# Patient Record
Sex: Female | Born: 1950 | Race: Black or African American | Hispanic: No | Marital: Married | Smoking: Current every day smoker
Health system: Southern US, Community
[De-identification: ages and names within clinical notes are randomized; demographics above are authoritative.]

## PROBLEM LIST (undated history)

## (undated) DIAGNOSIS — I2111 ST elevation (STEMI) myocardial infarction involving right coronary artery: Secondary | ICD-10-CM

## (undated) DIAGNOSIS — E785 Hyperlipidemia, unspecified: Secondary | ICD-10-CM

## (undated) HISTORY — PX: TUBAL LIGATION: SHX77

## (undated) HISTORY — PX: ABDOMINAL HYSTERECTOMY: SHX81

## (undated) HISTORY — PX: APPENDECTOMY: SHX54

## (undated) HISTORY — PX: LAPAROSCOPIC OOPHERECTOMY: SHX6507

---

## 2004-10-11 ENCOUNTER — Ambulatory Visit: Payer: Self-pay | Admitting: Unknown Physician Specialty

## 2004-11-16 ENCOUNTER — Ambulatory Visit: Payer: Self-pay | Admitting: Family Medicine

## 2005-07-22 ENCOUNTER — Ambulatory Visit: Payer: Self-pay | Admitting: Family Medicine

## 2006-06-01 ENCOUNTER — Ambulatory Visit: Payer: Self-pay | Admitting: Gastroenterology

## 2013-11-24 DIAGNOSIS — F17200 Nicotine dependence, unspecified, uncomplicated: Secondary | ICD-10-CM | POA: Insufficient documentation

## 2013-11-24 DIAGNOSIS — E785 Hyperlipidemia, unspecified: Secondary | ICD-10-CM | POA: Insufficient documentation

## 2013-11-24 DIAGNOSIS — R319 Hematuria, unspecified: Secondary | ICD-10-CM | POA: Insufficient documentation

## 2013-11-24 DIAGNOSIS — I10 Essential (primary) hypertension: Secondary | ICD-10-CM | POA: Insufficient documentation

## 2014-10-09 ENCOUNTER — Encounter: Payer: Self-pay | Admitting: Podiatry

## 2014-10-09 ENCOUNTER — Other Ambulatory Visit: Payer: Self-pay | Admitting: *Deleted

## 2014-10-09 ENCOUNTER — Ambulatory Visit (INDEPENDENT_AMBULATORY_CARE_PROVIDER_SITE_OTHER): Payer: PRIVATE HEALTH INSURANCE | Admitting: Podiatry

## 2014-10-09 ENCOUNTER — Encounter: Payer: Self-pay | Admitting: *Deleted

## 2014-10-09 ENCOUNTER — Ambulatory Visit (INDEPENDENT_AMBULATORY_CARE_PROVIDER_SITE_OTHER): Payer: PRIVATE HEALTH INSURANCE

## 2014-10-09 VITALS — BP 145/90 | HR 92 | Resp 18 | Ht 64.0 in | Wt 184.0 lb

## 2014-10-09 DIAGNOSIS — M722 Plantar fascial fibromatosis: Secondary | ICD-10-CM | POA: Diagnosis not present

## 2014-10-09 DIAGNOSIS — R0989 Other specified symptoms and signs involving the circulatory and respiratory systems: Secondary | ICD-10-CM | POA: Diagnosis not present

## 2014-10-09 NOTE — Progress Notes (Signed)
   Subjective:    Patient ID: Emma Weaver, female    DOB: November 09, 1950, 64 y.o.   MRN: [id number]  HPI  Patient presents today for concerns of "knots" on the bottom of her foot which has been ongoing for "years". She denies any pain to the area. She previously went to another podiatrist last year. At that time a wrap and discussed shoe changes was applied but no other treatment. She states the masses have not been growing recently. Denies any skin changes over the knots. Denies any tingling or numbness. No injury or trauma.she does that she has pain to her legs and she can walk approximately 2 blocks, stop due to pain in her legs. She denies any swelling to her legs or any increase in warmth. She has not had any vascular studies apparently previously. No other complaints at this time.    Review of Systems  All other systems reviewed and are negative.      Objective:   Physical Exam AAO x3, NAD DP/PT pulses decreased bilaterall Protective sensation intact with Simms Weinstein monofilament, vibratory sensation intact, Achilles tendon reflex intact On the plantar aspect of bilateral feet along the midfoot on the medial band of the plantar fascia there are 2 well defined, circumferential nodules which appear to be on the plantar fascia. They are large measuring about 2.5x2 cm each. There is no tenderness to palpation over the area and there is no overlying skin changes or discoloration. Subjectively they have no changed in size.  No other areas of tenderness to bilateral lower extremities. MMT 5/5, ROM WNL.  No open lesions or pre-ulcerative lesions.  No overlying edema, erythema, increase in warmth to bilateral lower extremities.  No pain with calf compression, swelling, warmth, erythema bilaterally.    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 -X-rays were obtained and reviewed with the patient.  -Treatment  options discussed including all alternatives, risks, and complications -Discussed etiology of the soft tissue masses. -I discussed both conservative and surgical treatment options. She would like to hold off on any injections, surgery. I prescribed compound cream to include verapamil to be applied daily. Discussed with her this may or may not help will be can try. I also discussed orthotics with offloading of the area. -Given claudication symptoms and decreased pulses (which has apparently been ongoing for quite a while and is not new), I ordered vascular studies. -Follow-up after vascular studies or sooner if any problems arise. In the meantime, encouraged to call the office with any questions, concerns, change in symptoms.   Celesta Gentile, DPM

## 2014-10-09 NOTE — Progress Notes (Signed)
Emma Weaver is scheduled with [facility] on 10/14/2014 at 345

## 2014-10-14 LAB — ABI

## 2014-10-16 ENCOUNTER — Telehealth: Payer: Self-pay | Admitting: *Deleted

## 2014-10-16 DIAGNOSIS — I739 Peripheral vascular disease, unspecified: Secondary | ICD-10-CM

## 2014-10-16 NOTE — Telephone Encounter (Signed)
Pt referred to [facility] [phone] and last chart notes and referral cover sheet sent to [phone].

## 2014-10-16 NOTE — Telephone Encounter (Signed)
I spoke with Otila Kluver - [facility], she states send pt's office notes and cover sheet for referral to [phone].  Done.

## 2014-10-16 NOTE — Telephone Encounter (Signed)
Entered in error

## 2014-10-16 NOTE — Telephone Encounter (Signed)
-----   Message from Trula Slade, DPM sent at 10/16/2014  2:12 PM EDT ----- This patient needs to be seen by [facility] for PVD as a consult. She already had studies which are abnormal. She also has claudication symptoms.

## 2014-10-17 ENCOUNTER — Telehealth: Payer: Self-pay | Admitting: *Deleted

## 2014-10-17 NOTE — Telephone Encounter (Signed)
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 If she wants them she can come in to be scanned. Not sure about insurance coverage.

## 2014-10-17 NOTE — Telephone Encounter (Addendum)
Pt states she received 2 medication creams in the mail and they were to be kept cold, but the ice pack was warm, would that affect the medication, how is she to use them.  I told pt to contact the customer service for the creams and check if they were okay and to use them as the instructions on the creams ordered.  Pt states she was told the creams were for pain, she doesn't have pain just lumps.  I told pt the main ingredient Verapamil in one of the creams was for sensations and lumps and she said she did have those.  Pt states she has already seen the Vein and Vascular people but did not get her results, I explained that her feet were not getting good blood flow, so Dr. Jacqualyn Posey referred back to [facility] for evaluation and treatment, pt states understanding and I gave the [facility] appt line number.  Then the pt asked about the shoes Dr. Jacqualyn Posey wanted her to have.  I told her I would ask and call her again.  I gave pt Dr. Leigh Aurora recommendation for the orthotics and she states she will set up appt for the orthotic scans, if someone will check her coverage.  Pt states she does not understand the medication cream Dr. Jacqualyn Posey ordered and I had explained the medication from what she read to me, but she says she would like someone to show her.  I told pt I would contact the [place] office to inform the pt would be by with the mediation to for explanation today or tomorrow.

## 2014-10-22 NOTE — Telephone Encounter (Signed)
Please advise 

## 2014-10-28 ENCOUNTER — Telehealth: Payer: Self-pay | Admitting: *Deleted

## 2014-10-28 NOTE — Telephone Encounter (Signed)
I am going to call today to see if the orthotics are covered. Emma Weaver

## 2014-10-28 NOTE — Telephone Encounter (Signed)
-----   Message from Jackquline Denmark sent at 10/28/2014  4:41 PM EDT ----- Regarding: MEDICATION INSTRUCTIONS Contact: [phone] PT CALLED AND NEEDS SOME INSTRUCTIONS ON HOW SHE SHOULD TAKE THE MEDICATION SHE WAS PRESCRIBED. PT IS A [facility] OF DR Leigh Aurora.

## 2014-10-29 NOTE — Telephone Encounter (Signed)
Please call this pt and have her bring the medications to the [place] office so someone can explain them to her.  I tried numerous times and she states she does not understand, maybe it needs to be hands on training.

## 2014-10-30 NOTE — Telephone Encounter (Signed)
Called patient today and told her to come by the [place] office and i would see what i could help her with. Emma Weaver

## 2014-11-04 NOTE — Telephone Encounter (Signed)
CALLED [employer] AND SPOKE WITH JOANN AND STATED THAT I NEEDED TO CHECK COVERAGE FOR INSERTS ([id number]) AND PER JOANN THEY ARE NOT COVERED UNDER THE PATIENT'S PLAN AND CALLED THE PATIENT TO LET HER KNOW. Emma Weaver

## 2014-11-05 NOTE — Telephone Encounter (Signed)
PATIENT WAS CALLED AND WAS WANTING INSERTS AND THE [employer] DOES NOT COVER UNDER THE PATIENT'S PLAN. Emma Weaver

## 2016-02-09 ENCOUNTER — Encounter: Payer: Self-pay | Admitting: Emergency Medicine

## 2016-02-09 ENCOUNTER — Emergency Department
Admission: EM | Admit: 2016-02-09 | Discharge: 2016-02-09 | Disposition: A | Discharge status: Home / Self Care | Payer: No Typology Code available for payment source | Attending: Emergency Medicine | Admitting: Emergency Medicine

## 2016-02-09 ENCOUNTER — Emergency Department: Payer: No Typology Code available for payment source

## 2016-02-09 DIAGNOSIS — I10 Essential (primary) hypertension: Secondary | ICD-10-CM | POA: Insufficient documentation

## 2016-02-09 DIAGNOSIS — Y9389 Activity, other specified: Secondary | ICD-10-CM | POA: Insufficient documentation

## 2016-02-09 DIAGNOSIS — S99921A Unspecified injury of right foot, initial encounter: Secondary | ICD-10-CM | POA: Diagnosis present

## 2016-02-09 DIAGNOSIS — F17200 Nicotine dependence, unspecified, uncomplicated: Secondary | ICD-10-CM | POA: Insufficient documentation

## 2016-02-09 DIAGNOSIS — Y999 Unspecified external cause status: Secondary | ICD-10-CM | POA: Diagnosis not present

## 2016-02-09 DIAGNOSIS — S92334A Nondisplaced fracture of third metatarsal bone, right foot, initial encounter for closed fracture: Secondary | ICD-10-CM | POA: Diagnosis not present

## 2016-02-09 DIAGNOSIS — Y92481 Parking lot as the place of occurrence of the external cause: Secondary | ICD-10-CM | POA: Insufficient documentation

## 2016-02-09 MED ORDER — TRAMADOL HCL 50 MG PO TABS
100.0000 mg | ORAL_TABLET | Freq: Once | ORAL | Status: AC
Start: 2016-02-09 — End: 2016-02-09
  Administered 2016-02-09: 100 mg via ORAL

## 2016-02-09 MED ORDER — TRAMADOL HCL 50 MG PO TABS
ORAL_TABLET | ORAL | Status: AC
  Administered 2016-02-09: 100 mg via ORAL
  Filled 2016-02-09: qty 2

## 2016-02-09 MED ORDER — TRAMADOL HCL 50 MG PO TABS: 50.0000 mg | ORAL_TABLET | Freq: Four times a day (QID) | ORAL | 0 refills | Status: AC | PRN
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
## 2016-02-09 NOTE — Discharge Instructions (Signed)
Please call the number provided for orthopedics to arrange a follow-up appointment in 5-7 days for recheck/reevaluation. Please use her crutches and do not bear weight on the foot until he can be evaluated by orthopedics. Please use her pain medication as needed, as prescribed. You may also use Tylenol as needed for discomfort in addition to prescribe medication. Return to the emergency department for any worsening pain, or any other symptom personally concerning to yourself.

## 2016-09-13 DIAGNOSIS — I2111 ST elevation (STEMI) myocardial infarction involving right coronary artery: Secondary | ICD-10-CM

## 2016-09-13 HISTORY — DX: ST elevation (STEMI) myocardial infarction involving right coronary artery: I21.11

## 2016-09-14 ENCOUNTER — Other Ambulatory Visit: Payer: Self-pay | Admitting: Family Medicine

## 2016-09-14 DIAGNOSIS — Z1239 Encounter for other screening for malignant neoplasm of breast: Secondary | ICD-10-CM

## 2017-06-20 ENCOUNTER — Ambulatory Visit
Admission: RE | Admit: 2017-06-20 | Discharge: 2017-06-20 | Disposition: A | Discharge status: Home / Self Care | Payer: Medicare PPO | Source: Ambulatory Visit | Attending: Family Medicine | Admitting: Family Medicine

## 2017-06-20 DIAGNOSIS — Z1231 Encounter for screening mammogram for malignant neoplasm of breast: Secondary | ICD-10-CM | POA: Diagnosis present

## 2017-06-20 DIAGNOSIS — Z1239 Encounter for other screening for malignant neoplasm of breast: Secondary | ICD-10-CM

## 2017-10-04 ENCOUNTER — Encounter
Admission: RE | Disposition: A | Discharge status: Home / Self Care | Payer: Self-pay | Source: Ambulatory Visit | Attending: Internal Medicine

## 2017-10-04 ENCOUNTER — Ambulatory Visit: Admission: RE | Admit: 2017-10-04 | Payer: Medicare PPO | Source: Ambulatory Visit | Admitting: Internal Medicine

## 2017-10-04 ENCOUNTER — Encounter: Admission: RE | Payer: Self-pay | Source: Ambulatory Visit

## 2017-10-04 ENCOUNTER — Ambulatory Visit
Admission: RE | Admit: 2017-10-04 | Discharge: 2017-10-04 | Disposition: A | Discharge status: Home / Self Care | Payer: Medicare PPO | Source: Ambulatory Visit | Attending: Internal Medicine | Admitting: Internal Medicine

## 2017-10-04 ENCOUNTER — Ambulatory Visit: Payer: Medicare PPO | Admitting: Anesthesiology

## 2017-10-04 ENCOUNTER — Encounter: Payer: Self-pay | Admitting: *Deleted

## 2017-10-04 DIAGNOSIS — F17200 Nicotine dependence, unspecified, uncomplicated: Secondary | ICD-10-CM | POA: Insufficient documentation

## 2017-10-04 DIAGNOSIS — K219 Gastro-esophageal reflux disease without esophagitis: Secondary | ICD-10-CM | POA: Insufficient documentation

## 2017-10-04 DIAGNOSIS — J449 Chronic obstructive pulmonary disease, unspecified: Secondary | ICD-10-CM | POA: Insufficient documentation

## 2017-10-04 DIAGNOSIS — K640 First degree hemorrhoids: Secondary | ICD-10-CM | POA: Insufficient documentation

## 2017-10-04 DIAGNOSIS — D123 Benign neoplasm of transverse colon: Secondary | ICD-10-CM | POA: Insufficient documentation

## 2017-10-04 DIAGNOSIS — Z955 Presence of coronary angioplasty implant and graft: Secondary | ICD-10-CM | POA: Diagnosis not present

## 2017-10-04 DIAGNOSIS — I2510 Atherosclerotic heart disease of native coronary artery without angina pectoris: Secondary | ICD-10-CM | POA: Insufficient documentation

## 2017-10-04 DIAGNOSIS — D125 Benign neoplasm of sigmoid colon: Secondary | ICD-10-CM | POA: Insufficient documentation

## 2017-10-04 DIAGNOSIS — I119 Hypertensive heart disease without heart failure: Secondary | ICD-10-CM | POA: Insufficient documentation

## 2017-10-04 DIAGNOSIS — K449 Diaphragmatic hernia without obstruction or gangrene: Secondary | ICD-10-CM | POA: Diagnosis not present

## 2017-10-04 DIAGNOSIS — D509 Iron deficiency anemia, unspecified: Secondary | ICD-10-CM | POA: Diagnosis not present

## 2017-10-04 DIAGNOSIS — E785 Hyperlipidemia, unspecified: Secondary | ICD-10-CM | POA: Insufficient documentation

## 2017-10-04 DIAGNOSIS — D120 Benign neoplasm of cecum: Secondary | ICD-10-CM | POA: Diagnosis not present

## 2017-10-04 DIAGNOSIS — R195 Other fecal abnormalities: Secondary | ICD-10-CM | POA: Insufficient documentation

## 2017-10-04 DIAGNOSIS — Z888 Allergy status to other drugs, medicaments and biological substances status: Secondary | ICD-10-CM | POA: Diagnosis not present

## 2017-10-04 HISTORY — PX: ESOPHAGOGASTRODUODENOSCOPY (EGD) WITH PROPOFOL: SHX5813

## 2017-10-04 HISTORY — DX: ST elevation (STEMI) myocardial infarction involving right coronary artery: I21.11

## 2017-10-04 HISTORY — PX: COLONOSCOPY WITH PROPOFOL: SHX5780

## 2017-10-04 HISTORY — DX: Hyperlipidemia, unspecified: E78.5

## 2017-10-04 SURGERY — COLONOSCOPY WITH PROPOFOL
Anesthesia: General

## 2017-10-04 SURGERY — ESOPHAGOGASTRODUODENOSCOPY (EGD) WITH PROPOFOL
Anesthesia: General

## 2017-10-04 MED ORDER — LIDOCAINE 2% (20 MG/ML) 5 ML SYRINGE
INTRAMUSCULAR | Status: DC | PRN
  Administered 2017-10-04: 40 mg via INTRAVENOUS

## 2017-10-04 MED ORDER — PROPOFOL 10 MG/ML IV BOLUS
INTRAVENOUS | Status: DC | PRN
  Administered 2017-10-04: 100 mg via INTRAVENOUS

## 2017-10-04 MED ORDER — PROPOFOL 500 MG/50ML IV EMUL
INTRAVENOUS | Status: DC | PRN
  Administered 2017-10-04: 160 ug/kg/min via INTRAVENOUS

## 2017-10-04 MED ORDER — SODIUM CHLORIDE 0.9 % IV SOLN
INTRAVENOUS | Status: DC
  Administered 2017-10-04: 1000 mL via INTRAVENOUS
  Administered 2017-10-04: 10:00:00 via INTRAVENOUS

## 2017-10-04 MED ORDER — IPRATROPIUM-ALBUTEROL 0.5-2.5 (3) MG/3ML IN SOLN
RESPIRATORY_TRACT | Status: AC
  Administered 2017-10-04: 3 mL via RESPIRATORY_TRACT
  Filled 2017-10-04: qty 3

## 2017-10-04 MED ORDER — PHENYLEPHRINE HCL 10 MG/ML IJ SOLN
INTRAMUSCULAR | Status: DC | PRN
  Administered 2017-10-04 (×3): 100 ug via INTRAVENOUS

## 2017-10-04 MED ORDER — IPRATROPIUM-ALBUTEROL 0.5-2.5 (3) MG/3ML IN SOLN
3.0000 mL | Freq: Once | RESPIRATORY_TRACT | Status: AC
  Administered 2017-10-04: 3 mL via RESPIRATORY_TRACT

## 2017-10-04 MED ORDER — FENTANYL CITRATE (PF) 100 MCG/2ML IJ SOLN
INTRAMUSCULAR | Status: AC
  Filled 2017-10-04: qty 2

## 2017-10-04 MED ORDER — FENTANYL CITRATE (PF) 100 MCG/2ML IJ SOLN
INTRAMUSCULAR | Status: DC | PRN
  Administered 2017-10-04 (×2): 50 ug via INTRAVENOUS

## 2017-10-04 MED ORDER — PROPOFOL 10 MG/ML IV BOLUS
INTRAVENOUS | Status: AC
  Filled 2017-10-04: qty 20

## 2017-10-04 MED ORDER — LIDOCAINE HCL (PF) 1 % IJ SOLN
2.0000 mL | Freq: Once | INTRAMUSCULAR | Status: AC
  Administered 2017-10-04: 0.3 mL via INTRADERMAL

## 2017-10-04 MED ORDER — PROPOFOL 500 MG/50ML IV EMUL
INTRAVENOUS | Status: AC
  Filled 2017-10-04: qty 50

## 2017-10-04 MED ORDER — LIDOCAINE HCL (PF) 1 % IJ SOLN
INTRAMUSCULAR | Status: AC
  Administered 2017-10-04: 0.3 mL via INTRADERMAL
  Filled 2017-10-04: qty 2

## 2017-10-04 NOTE — Anesthesia Post-op Follow-up Note (Signed)
Anesthesia QCDR form completed.        

## 2017-10-04 NOTE — Anesthesia Preprocedure Evaluation (Signed)
Anesthesia Evaluation  Patient identified by MRN, date of birth, ID band Patient awake    Reviewed: Allergy & Precautions, H&P , NPO status , Patient's Chart, lab work & pertinent test results  History of Anesthesia Complications Negative for: history of anesthetic complications  Airway Mallampati: III  TM Distance: >3 FB Neck ROM: limited    Dental  (+) Chipped, Poor Dentition   Pulmonary COPD, Current Smoker,           Cardiovascular Exercise Tolerance: Good hypertension, (-) angina+ Past MI and + Cardiac Stents  (-) DOE      Neuro/Psych negative neurological ROS  negative psych ROS   GI/Hepatic negative GI ROS, Neg liver ROS, neg GERD  ,  Endo/Other  negative endocrine ROS  Renal/GU negative Renal ROS  negative genitourinary   Musculoskeletal   Abdominal   Peds  Hematology negative hematology ROS (+)   Anesthesia Other Findings Past Medical History: No date: Hyperlipidemia 09/13/2016: 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 (HCC)  Past Surgical History: No date: ABDOMINAL HYSTERECTOMY No date: APPENDECTOMY No date: LAPAROSCOPIC OOPHERECTOMY; Right No date: TUBAL LIGATION  BMI    Body Mass Index:  33.30 kg/m      Reproductive/Obstetrics negative OB ROS                             Anesthesia Physical Anesthesia Plan  ASA: III  Anesthesia Plan: General   Post-op Pain Management:    Induction: Intravenous  PONV Risk Score and Plan: Propofol infusion and TIVA  Airway Management Planned: Natural Airway and Nasal Cannula  Additional Equipment:   Intra-op Plan:   Post-operative Plan:   Informed Consent: I have reviewed the patients History and Physical, chart, labs and discussed the procedure including the risks, benefits and alternatives for the proposed anesthesia with the patient or authorized representative who has indicated  his/her understanding and acceptance.   Dental Advisory Given  Plan Discussed with: Anesthesiologist, CRNA and Surgeon  Anesthesia Plan Comments: (Patient consented for risks of anesthesia including but not limited to:  - adverse reactions to medications - risk of intubation if required - damage to teeth, lips or other oral mucosa - sore throat or hoarseness - Damage to heart, brain, lungs or loss of life  Patient voiced understanding.)        Anesthesia Quick Evaluation

## 2017-10-04 NOTE — Transfer of Care (Signed)
Immediate Anesthesia Transfer of Care Note  Patient: Emma Weaver  Procedure(s) Performed: COLONOSCOPY WITH PROPOFOL (N/A ) ESOPHAGOGASTRODUODENOSCOPY (EGD) WITH PROPOFOL (N/A )  Patient Location: PACU and Endoscopy Unit  Anesthesia Type:General  Level of Consciousness: awake, drowsy and patient cooperative  Airway & Oxygen Therapy: Patient Spontanous Breathing and Patient connected to nasal cannula oxygen  Post-op Assessment: Report given to RN and Post -op Vital signs reviewed and stable  Post vital signs: Reviewed and stable  Last Vitals:  Vitals Value Taken Time  BP 94/59 10/04/2017 11:20 AM  Temp 36.1 C 10/04/2017 11:16 AM  Pulse 84 10/04/2017 11:21 AM  Resp 19 10/04/2017 11:21 AM  SpO2 100 % 10/04/2017 11:21 AM  Vitals shown include unvalidated device data.  Last Pain:  Vitals:   10/04/17 1120  TempSrc:   PainSc: 0-No pain         Complications: No apparent anesthesia complications

## 2017-10-04 NOTE — Op Note (Signed)
[facility] Patient Name: James Lafalce Procedure Date: 10/04/2017 10:24 AM MRN: [id number] Account #: [id number] Date of Birth: 1950-09-29 Admit Type: Outpatient Age: 67 Room: [facility] ENDO ROOM 2 Gender: Female Note Status: Finalized Procedure:            Upper GI endoscopy Indications:          Suspected upper gastrointestinal bleeding in patient                        with unexplained iron deficiency anemia, Heme positive                        stool Providers:            Benay Pike. [name] MD, MD Referring MD:         Bo Mcclintock. Vickki Muff (Referring MD) Medicines:            Propofol per Anesthesia Complications:        No immediate complications. Procedure:            Pre-Anesthesia Assessment:                       - The risks and benefits of the procedure and the                        sedation options and risks were discussed with the                        patient. All questions were answered and informed                        consent was obtained.                       - Patient identification and proposed procedure were                        verified prior to the procedure by the nurse. The                        procedure was verified in the procedure room.                       - ASA Grade Assessment: III - A patient with severe                        systemic disease.                       - After reviewing the risks and benefits, the patient                        was deemed in satisfactory condition to undergo the                        procedure.                       After obtaining informed consent, the endoscope was  passed under direct vision. Throughout the procedure,                        the patient's blood pressure, pulse, and oxygen                        saturations were monitored continuously. The Endoscope                        was introduced through the mouth, and advanced to the         third part of duodenum. The upper GI endoscopy was                        accomplished without difficulty. The patient tolerated                        the procedure well. Findings:      The examined esophagus was normal.      A 1 cm hiatal hernia was present.      The examined duodenum was normal.      The exam was otherwise without abnormality. Impression:           - Normal esophagus.                       - 1 cm hiatal hernia.                       - Normal examined duodenum.                       - The examination was otherwise normal.                       - No specimens collected. Recommendation:       - Proceed with colonoscopy Procedure Code(s):    --- Professional ---                       [phone], Esophagogastroduodenoscopy, flexible, transoral;                        diagnostic, including collection of specimen(s) by                        brushing or washing, when performed (separate procedure) Diagnosis Code(s):    --- Professional ---                       R19.5, Other fecal abnormalities                       D50.9, Iron deficiency anemia, unspecified                       K44.9, Diaphragmatic hernia without obstruction or                        gangrene CPT copyright 2017 American Medical Association. All rights reserved. The codes documented in this report are preliminary and upon coder review may  be revised to meet current compliance requirements. Efrain Sella MD, MD 10/04/2017 10:44:20 AM This report has been signed electronically. Number of Addenda: 0  Note Initiated On: 10/04/2017 10:24 AM      [facility]

## 2017-10-04 NOTE — Op Note (Signed)
[facility]astroenter[facility] Patient Name: Emma Weaver Procedure Date: 10/04/2017 10:24 AM MRN: [id number] Account #: [id number] Date of Birth: November 14, 1950 Admit Type: Outpatient Age: 67 Room: [facility] ENDO ROOM 2 Gender: Female Note Status: Finalized Procedure:            Colonoscopy Indications:          Heme positive stool Providers:            Benay Pike. [place] MD, MD Medicines:            Propofol per Anesthesia Complications:        No immediate complications. Procedure:            Pre-Anesthesia Assessment:                       - The risks and benefits of the procedure and the                        sedation options and risks were discussed with the                        patient. All questions were answered and informed                        consent was obtained.                       - Patient identification and proposed procedure were                        verified prior to the procedure by the nurse. The                        procedure was verified in the procedure room.                       - ASA Grade Assessment: III - A patient with severe                        systemic disease.                       - After reviewing the risks and benefits, the patient                        was deemed in satisfactory condition to undergo the                        procedure.                       After obtaining informed consent, the colonoscope was                        passed under direct vision. Throughout the procedure,                        the patient's blood pressure, pulse, and oxygen                        saturations were monitored continuously. The  Colonoscope was introduced through the anus and                        advanced to the the cecum, identified by appendiceal                        orifice and ileocecal valve. The colonoscopy was                        performed without difficulty. The patient tolerated the                         procedure well. The quality of the bowel preparation                        was good. Findings:      The perianal and digital rectal examinations were normal. Pertinent       negatives include normal sphincter tone and no palpable rectal lesions.      A 8 mm polyp was found in the cecum. The polyp was semi-pedunculated.       The polyp was removed with a hot snare. Resection and retrieval were       complete. To prevent bleeding after the polypectomy, two hemostatic       clips were successfully placed. There was no bleeding during, or at the       end, of the procedure.      A 5 mm polyp was found in the transverse colon. The polyp was sessile.       The polyp was removed with a cold snare. Resection and retrieval were       complete.      Four sessile polyps were found in the sigmoid colon, distal transverse       colon and cecum. The polyps were 2 to 3 mm in size. These polyps were       removed with a jumbo cold forceps. Resection and retrieval were complete.      Non-bleeding internal hemorrhoids were found during retroflexion. The       hemorrhoids were Grade I (internal hemorrhoids that do not prolapse).      The exam was otherwise without abnormality. Impression:           - One 8 mm polyp in the cecum, removed with a hot                        snare. Resected and retrieved. Clips were placed.                       - One 5 mm polyp in the transverse colon, removed with                        a cold snare. Resected and retrieved.                       - Four 2 to 3 mm polyps in the sigmoid colon, in the                        distal transverse colon and in the cecum, removed with  a jumbo cold forceps. Resected and retrieved.                       - Non-bleeding internal hemorrhoids.                       - The examination was otherwise normal. Recommendation:       - Patient has a contact number available for                         emergencies. The signs and symptoms of potential                        delayed complications were discussed with the patient.                        Return to normal activities tomorrow. Written discharge                        instructions were provided to the patient.                       - Will consider small bowel wireless capsule endoscopy                        based upon review of pathology results.                       - Repeat colonoscopy for surveillance based on                        pathology results.                       - Return to GI office in 3 months.                       - The findings and recommendations were discussed with                        the patient and their family. Procedure Code(s):    --- Professional ---                       [phone], Colonoscopy, flexible; with removal of tumor(s),                        polyp(s), or other lesion(s) by snare technique                       45380, 59, Colonoscopy, flexible; with biopsy, single                        or multiple Diagnosis Code(s):    --- Professional ---                       R19.5, Other fecal abnormalities                       K64.0, First degree hemorrhoids  D12.0, Benign neoplasm of cecum                       D12.3, Benign neoplasm of transverse colon (hepatic                        flexure or splenic flexure)                       D12.5, Benign neoplasm of sigmoid colon CPT copyright 2017 American Medical Association. All rights reserved. The codes documented in this report are preliminary and upon coder review may  be revised to meet current compliance requirements. Efrain Sella MD, MD 10/04/2017 11:20:01 AM This report has been signed electronically. Number of Addenda: 0 Note Initiated On: 10/04/2017 10:24 AM Scope Withdrawal Time: 0 hours 19 minutes 19 seconds  Total Procedure Duration: 0 hours 24 minutes 44 seconds       [facility]

## 2017-10-04 NOTE — Anesthesia Postprocedure Evaluation (Signed)
Anesthesia Post Note  Patient: Emma Weaver  Procedure(s) Performed: COLONOSCOPY WITH PROPOFOL (N/A ) ESOPHAGOGASTRODUODENOSCOPY (EGD) WITH PROPOFOL (N/A )  Patient location during evaluation: Endoscopy Anesthesia Type: General Level of consciousness: awake and alert Pain management: pain level controlled Vital Signs Assessment: post-procedure vital signs reviewed and stable Respiratory status: spontaneous breathing, nonlabored ventilation, respiratory function stable and patient connected to nasal cannula oxygen Cardiovascular status: blood pressure returned to baseline and stable Postop Assessment: no apparent nausea or vomiting Anesthetic complications: no     Last Vitals:  Vitals:   10/04/17 1120 10/04/17 1122  BP: (!) 94/59 (!) 94/59  Pulse:  85  Resp:  18  Temp:  (!) 36.1 C  SpO2:  100%    Last Pain:  Vitals:   10/04/17 1156  TempSrc:   PainSc: 0-No pain                 Precious Haws Myrlene Riera

## 2017-10-04 NOTE — H&P (Signed)
Outpatient short stay form Pre-procedure 10/04/2017 8:43 AM Emma Weaver K. [name], M.D.  Primary Physician: Cherlynn Polo, M.D.  Reason for visit:  Iron deficiency anemia, heme positive stool.  History of present illness:  67 y/o female without upper GI symptoms presents with intermittent hematochezia and was noted to have iron deficiency anemia with a ferritin of 9. Had colonoscopy several years ago with "few polyps removed". Denies change in bowel habits, involuntary weight loss. No CP, SOB or DOE currently.    No current facility-administered medications for this encounter.   Current Outpatient Medications:  .  EPINEPHrine (EPIPEN 2-PAK) 0.3 mg/0.3 mL IJ SOAJ injection, Inject into the muscle as needed., Disp: , Rfl:  .  levocetirizine (XYZAL) 5 MG tablet, Take 5 mg by mouth every evening., Disp: , Rfl:  .  metoprolol succinate (TOPROL-XL) 25 MG 24 hr tablet, Take 25 mg by mouth daily., Disp: , Rfl:  .  nitroGLYCERIN (NITROSTAT) 0.4 MG SL tablet, Place 0.4 mg under the tongue every 5 (five) minutes as needed for chest pain., Disp: , Rfl:  .  vitamin B-12 (CYANOCOBALAMIN) 500 MCG tablet, Take 500 mcg by mouth daily., Disp: , Rfl:  .  atorvastatin (LIPITOR) 20 MG tablet, Take by mouth., Disp: , Rfl:  .  fluticasone (FLONASE) 50 MCG/ACT nasal spray, Place into the nose., Disp: , Rfl:   No medications prior to admission.     Allergies  Allergen Reactions  . Lisinopril     Angioedema     Past Medical History:  Diagnosis Date  . Hyperlipidemia   . ST elevation (STEMI) myocardial infarction involving right coronary artery ([place]) 09/13/2016    Review of systems:  Otherwise negative.    Physical Exam  Gen: Alert, oriented. Appears stated age.  HEENT: [place]/AT. PERRLA. Lungs: CTA, no wheezes. CV: RR nl S1, S2. Abd: soft, benign, no masses. BS+ Ext: No edema. Pulses 2+    Planned procedures: Proceed with EGD and colonoscopy. The patient understands the nature of the planned  procedure, indications, risks, alternatives and potential complications including but not limited to bleeding, infection, perforation, damage to internal organs and possible oversedation/side effects from anesthesia. The patient agrees and gives consent to proceed.  Please refer to procedure notes for findings, recommendations and patient disposition/instructions.     Nawal Burling K. [name], M.D. Gastroenterology 10/04/2017  8:43 AM

## 2017-10-05 ENCOUNTER — Encounter: Payer: Self-pay | Admitting: Internal Medicine

## 2017-10-05 LAB — SURGICAL PATHOLOGY

## 2018-08-08 ENCOUNTER — Other Ambulatory Visit: Payer: Self-pay | Admitting: Family Medicine

## 2018-08-08 DIAGNOSIS — Z1231 Encounter for screening mammogram for malignant neoplasm of breast: Secondary | ICD-10-CM

## 2018-10-08 ENCOUNTER — Other Ambulatory Visit: Payer: Self-pay | Admitting: Family Medicine

## 2018-10-08 DIAGNOSIS — Z780 Asymptomatic menopausal state: Secondary | ICD-10-CM

## 2019-03-21 ENCOUNTER — Other Ambulatory Visit: Payer: Medicare PPO

## 2019-04-16 ENCOUNTER — Ambulatory Visit
Admission: RE | Admit: 2019-04-16 | Discharge: 2019-04-16 | Disposition: A | Discharge status: Home / Self Care | Payer: Medicare PPO | Source: Ambulatory Visit | Attending: Family Medicine | Admitting: Family Medicine

## 2019-04-16 DIAGNOSIS — Z780 Asymptomatic menopausal state: Secondary | ICD-10-CM | POA: Insufficient documentation

## 2019-04-16 DIAGNOSIS — Z1231 Encounter for screening mammogram for malignant neoplasm of breast: Secondary | ICD-10-CM | POA: Insufficient documentation

## 2021-05-24 IMAGING — MG DIGITAL SCREENING BILAT W/ TOMO W/ CAD
8 series · 8 of 24 positions shown · non-contrast
Comparison: Previous exam(s).

CLINICAL DATA: Screening.

EXAM:
DIGITAL SCREENING BILATERAL MAMMOGRAM WITH TOMO AND CAD

[L MLO synth-2D]
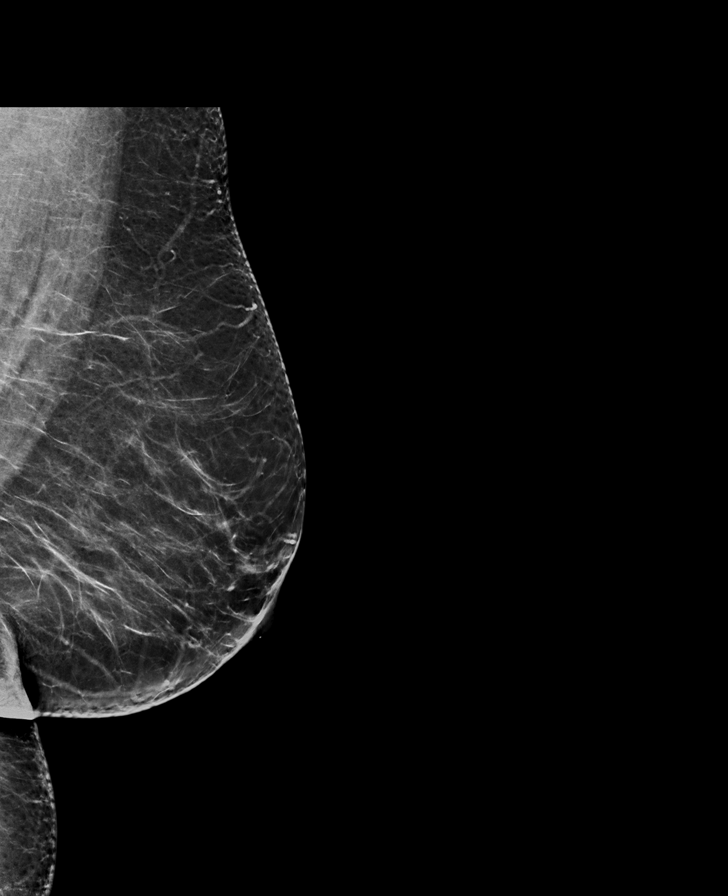

[R MLO synth-2D]
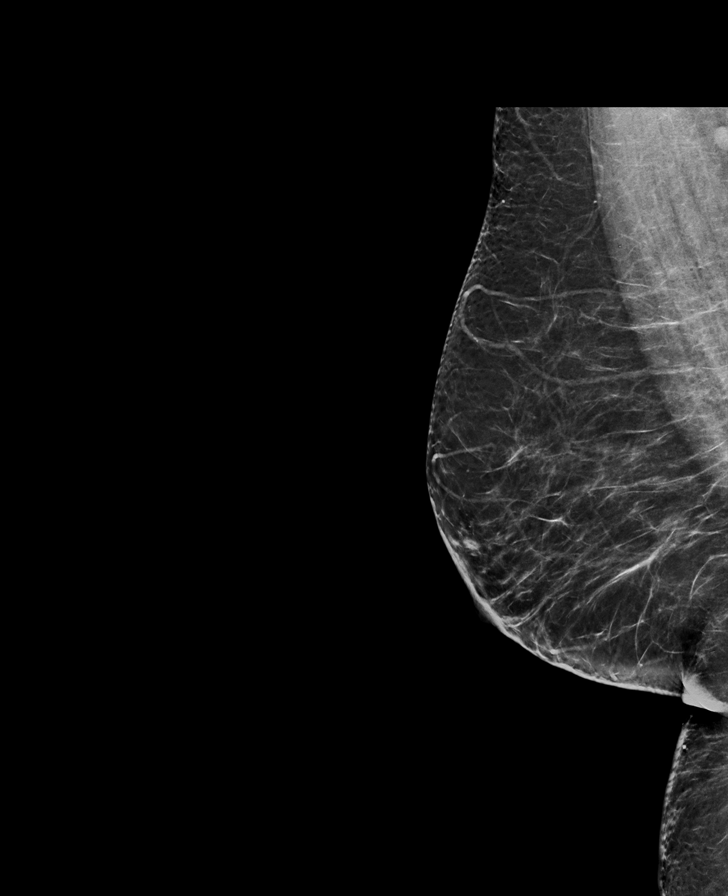

[L CC synth-2D]
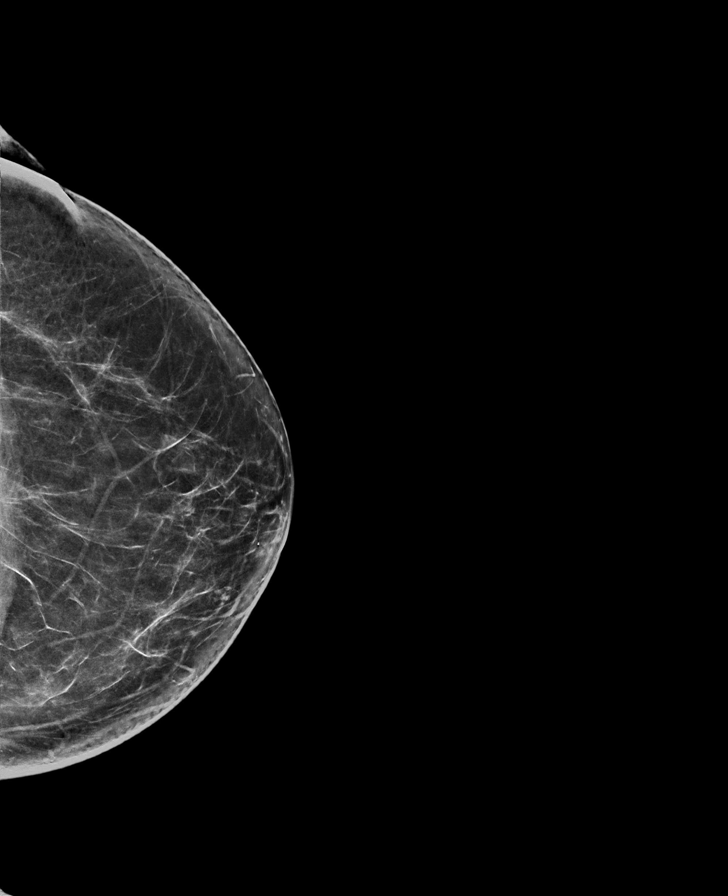

[R CC synth-2D]
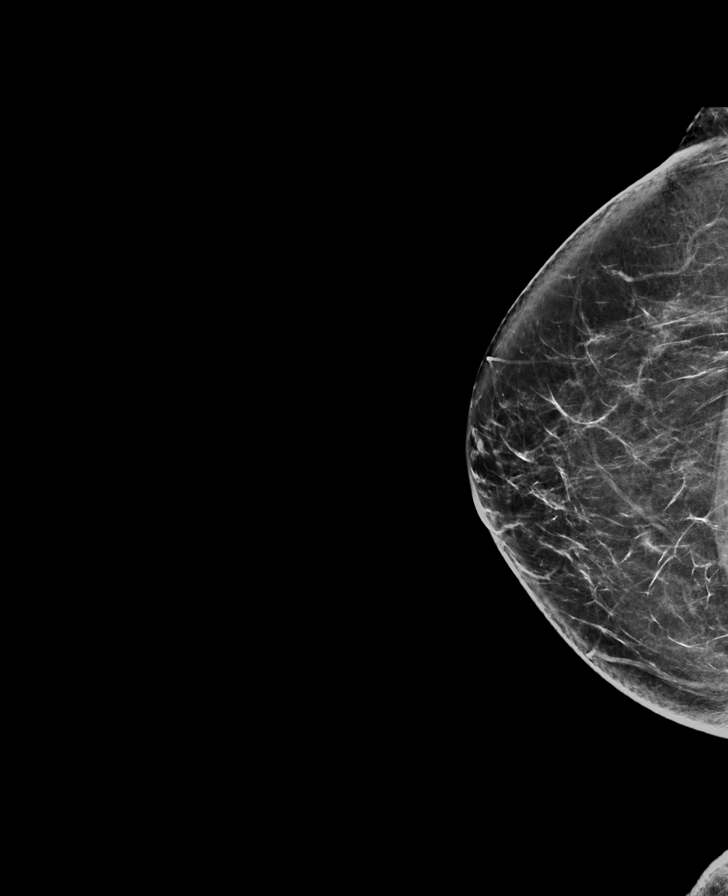

[R CC tomo · tomo slice 35/70.0]
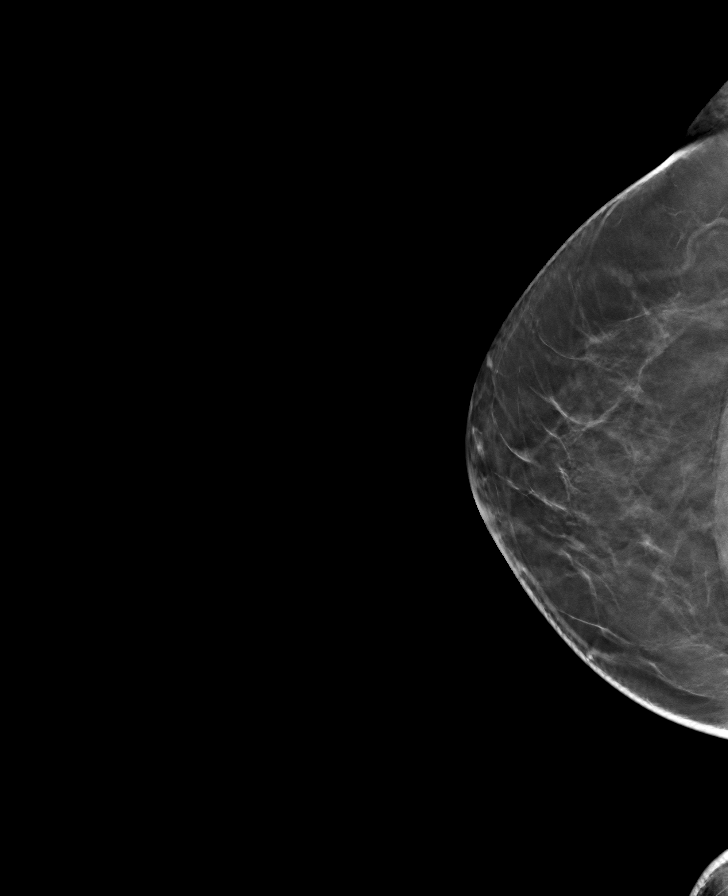

[L MLO tomo · tomo slice 37/73.0]
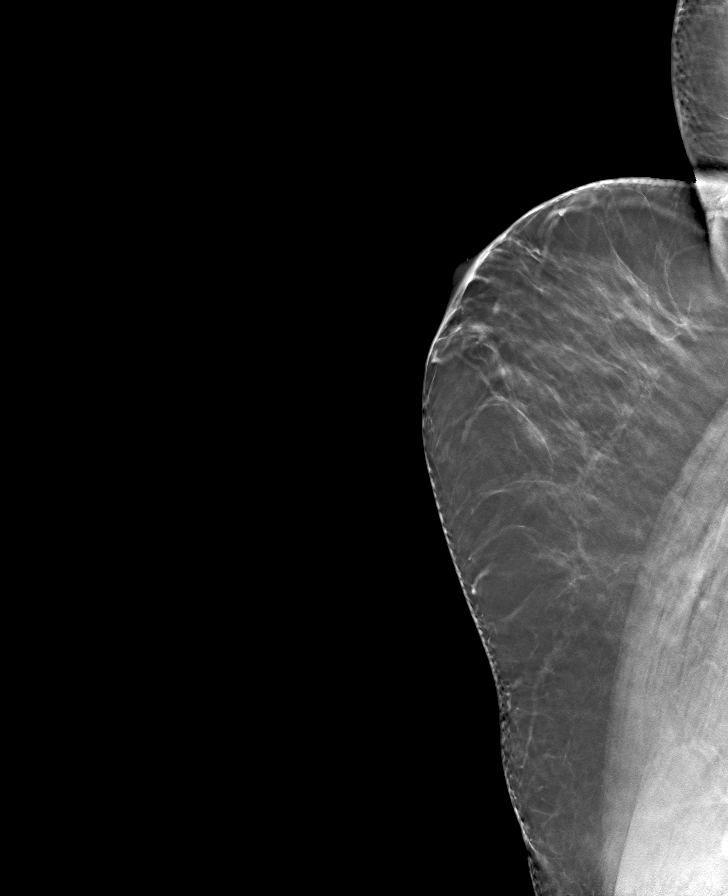

[L CC tomo · tomo slice 35/70.0]
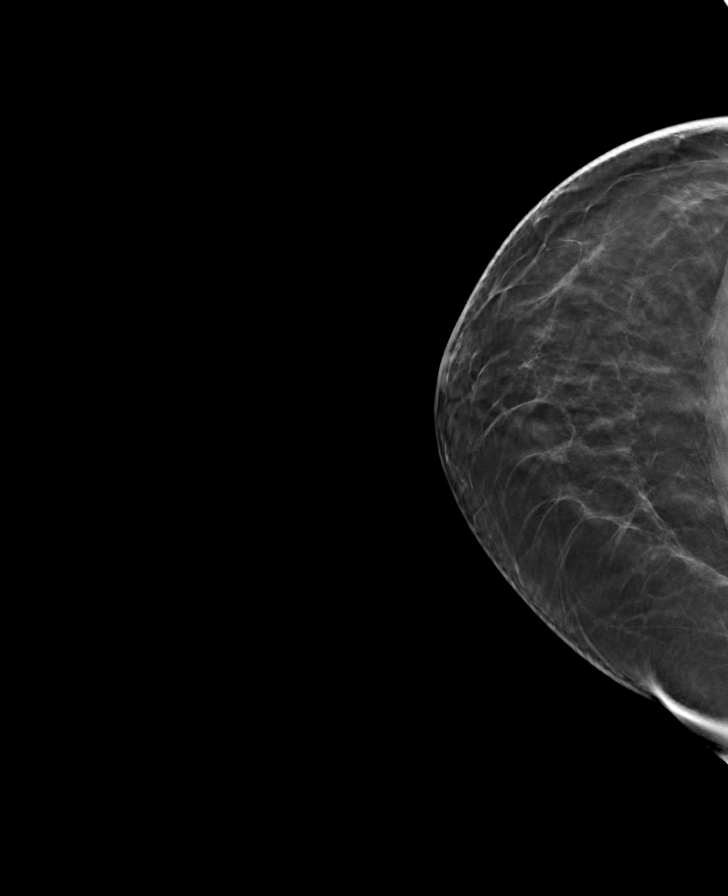

[R MLO tomo · tomo slice 37/72.0]
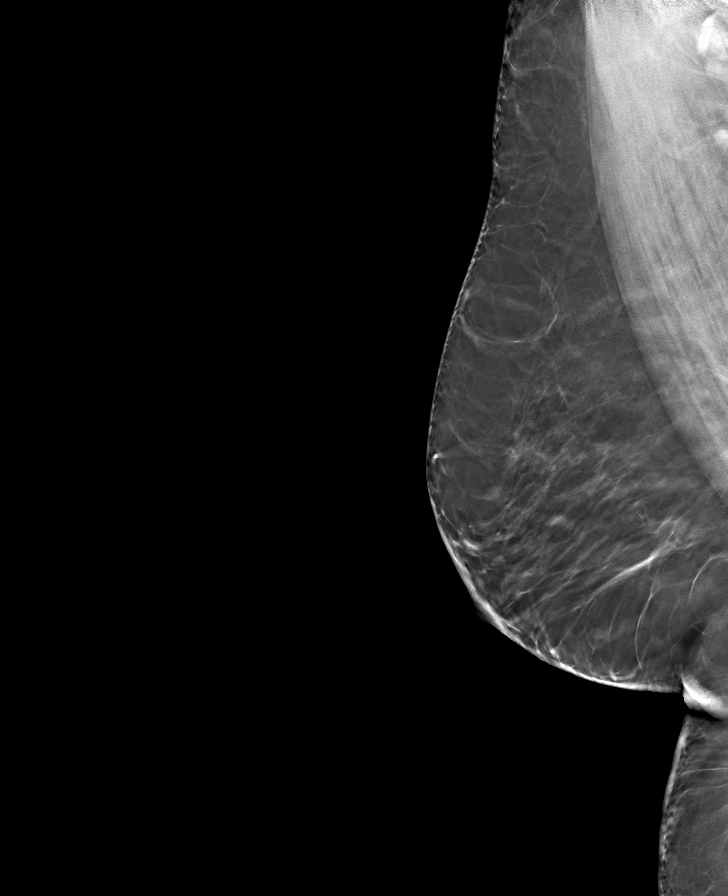

[8 of 24 positions shown; findings below may reference images not displayed]

ACR Breast Density Category b: There are scattered areas of
fibroglandular density.
FINDINGS: There are no findings suspicious for malignancy. Images were
processed with CAD.
IMPRESSION: No mammographic evidence of malignancy. A result letter of this
screening mammogram will be mailed directly to the patient.

RECOMMENDATION:
Screening mammogram in one year. (Code:[id number])

BI-RADS CATEGORY  1: Negative.

## 2021-11-18 ENCOUNTER — Ambulatory Visit (LOCAL_COMMUNITY_HEALTH_CENTER): Payer: Medicare PPO

## 2021-11-18 ENCOUNTER — Ambulatory Visit: Payer: Self-pay

## 2021-11-18 DIAGNOSIS — Z9289 Personal history of other medical treatment: Secondary | ICD-10-CM

## 2021-11-18 NOTE — Progress Notes (Signed)
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 Josie Saunders, RN

## 2024-03-05 ENCOUNTER — Other Ambulatory Visit: Payer: Self-pay | Admitting: Family Medicine

## 2024-03-05 DIAGNOSIS — Z1231 Encounter for screening mammogram for malignant neoplasm of breast: Secondary | ICD-10-CM

## 2024-03-12 ENCOUNTER — Other Ambulatory Visit: Payer: Self-pay | Admitting: Family Medicine

## 2024-03-12 DIAGNOSIS — Z780 Asymptomatic menopausal state: Secondary | ICD-10-CM

## 2024-04-09 ENCOUNTER — Ambulatory Visit
Admission: RE | Admit: 2024-04-09 | Discharge: 2024-04-09 | Disposition: A | Source: Ambulatory Visit | Attending: Family Medicine | Admitting: Family Medicine

## 2024-04-09 DIAGNOSIS — Z1231 Encounter for screening mammogram for malignant neoplasm of breast: Secondary | ICD-10-CM

## 2024-04-09 DIAGNOSIS — Z780 Asymptomatic menopausal state: Secondary | ICD-10-CM
